# Patient Record
Sex: Male | Born: 1965 | Race: White | Hispanic: No | Marital: Married | Smoking: Never smoker
Health system: Southern US, Community
[De-identification: ages and names within clinical notes are randomized; demographics above are authoritative.]

## PROBLEM LIST (undated history)

## (undated) DIAGNOSIS — E7800 Pure hypercholesterolemia, unspecified: Secondary | ICD-10-CM

---

## 2005-11-10 ENCOUNTER — Ambulatory Visit: Payer: Self-pay | Admitting: Family Medicine

## 2007-04-16 IMAGING — CT CT ABD-PELV W/ CM
1 of 2 series · 15 of 32 positions shown, 19 images · non-contrast
Comparison: none

REASON FOR EXAM: abdominal pain and low grade fever
COMMENTS:

[Series 2: soft tissue · axial · 0.77mm/px · z∈[-379,+93]mm · 15 of 65 slices shown, 19 images]
[im 3/65  soft-tissue]
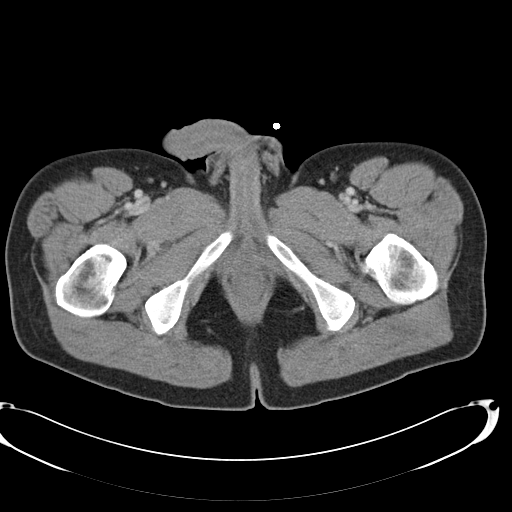
[im 3/65  bone]
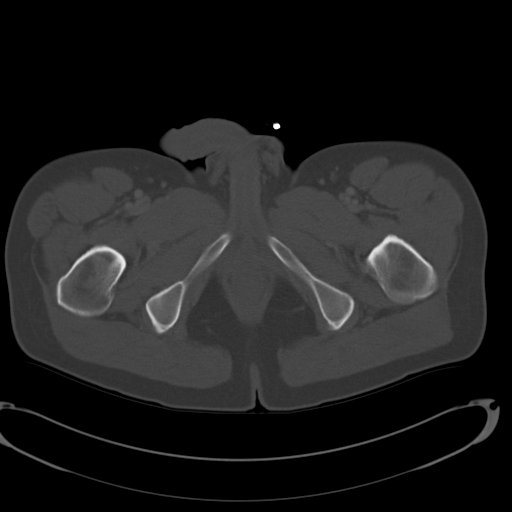
[im 9/65  soft-tissue]
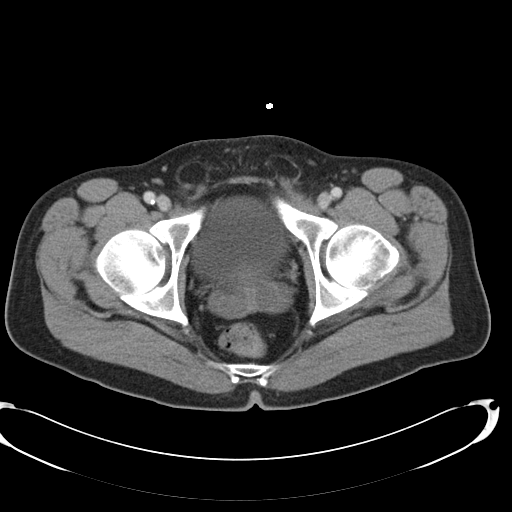
[im 15/65  soft-tissue]
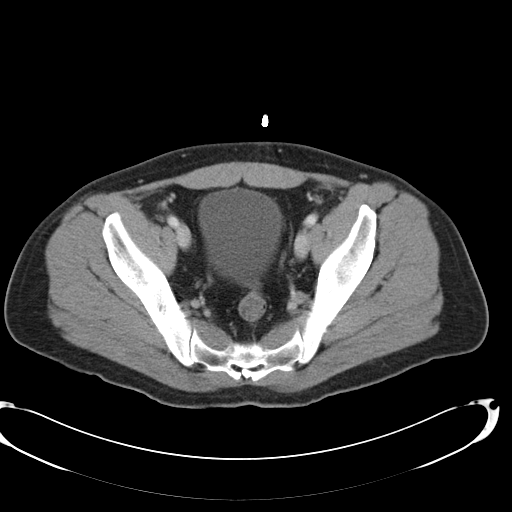
[im 18/65  soft-tissue]
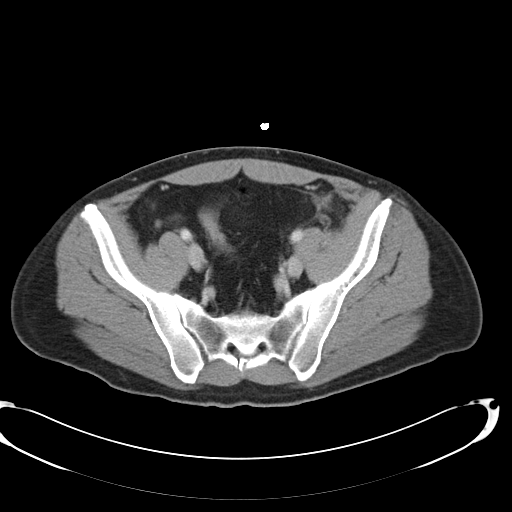
[im 24/65  soft-tissue]
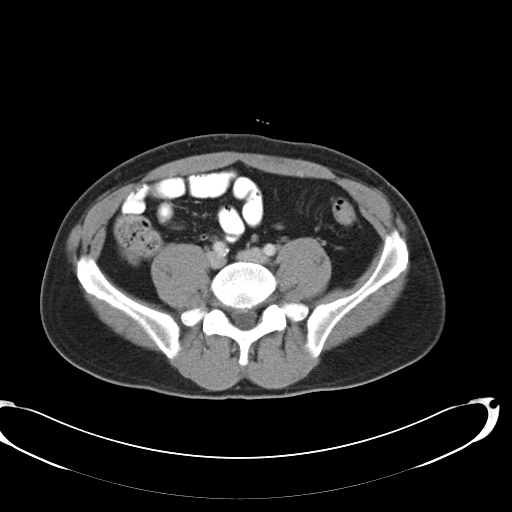
[im 27/65  soft-tissue]
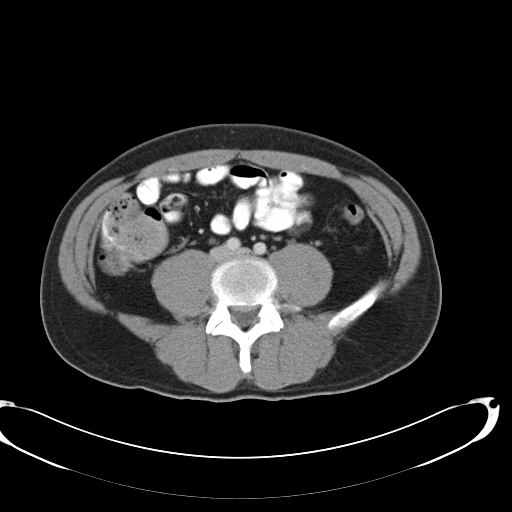
[im 33/65  soft-tissue]
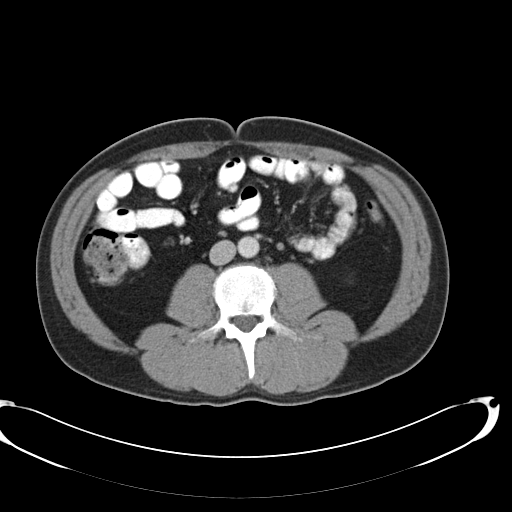
[im 38/65  soft-tissue]
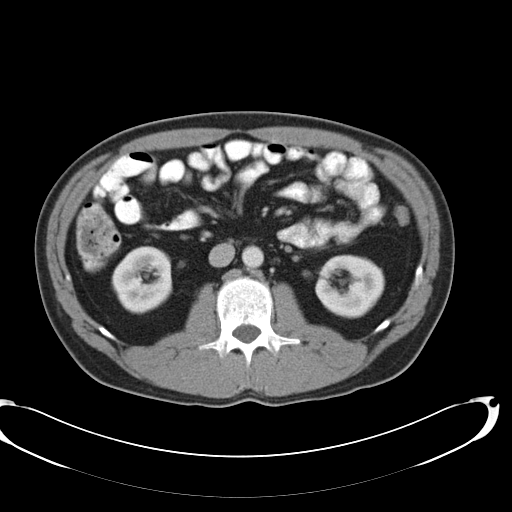
[im 41/65  soft-tissue]
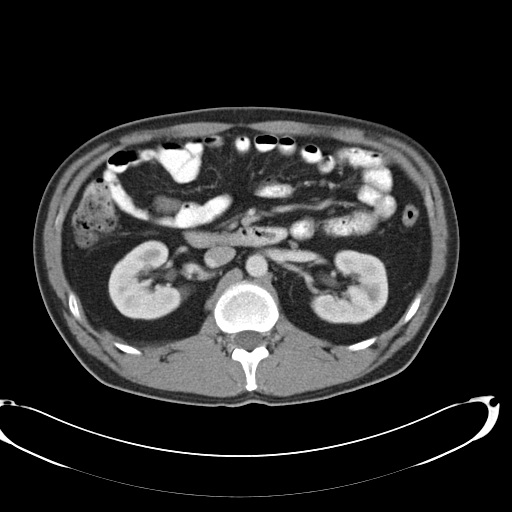
[im 41/65  bone]
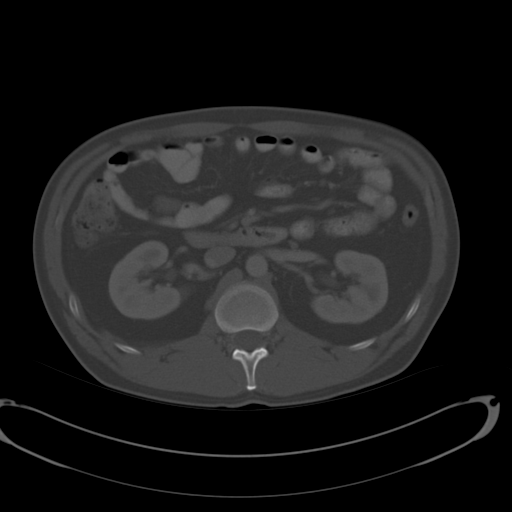
[im 47/65  soft-tissue]
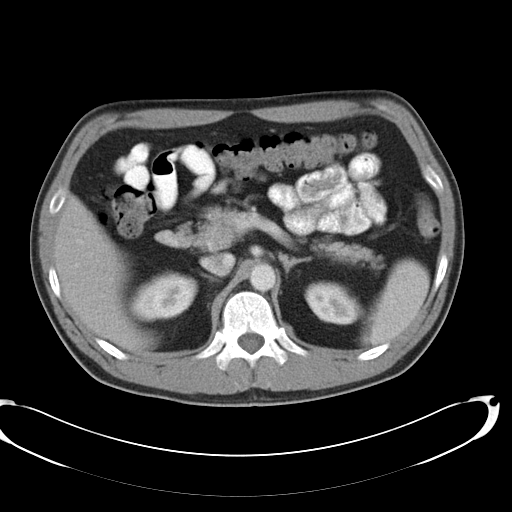
[im 50/65  soft-tissue]
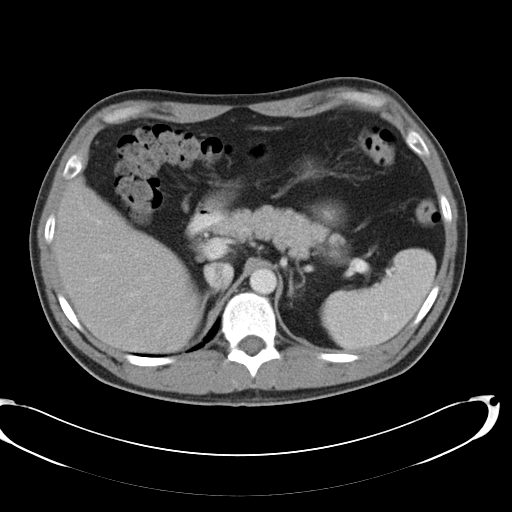
[im 53/65  lung]
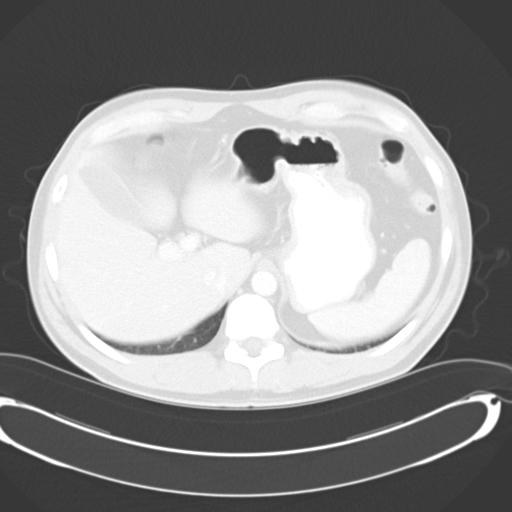
[im 56/65  soft-tissue]
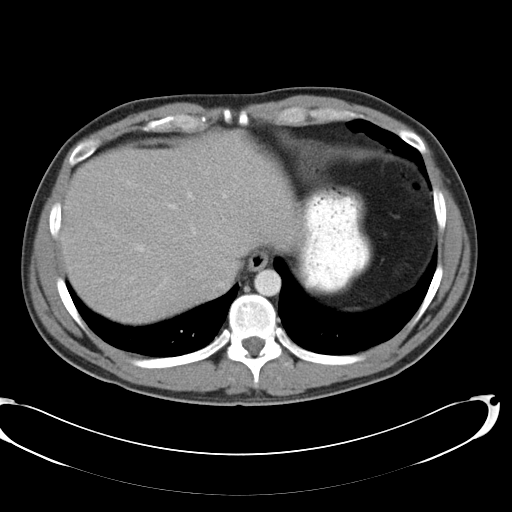
[im 56/65  lung]
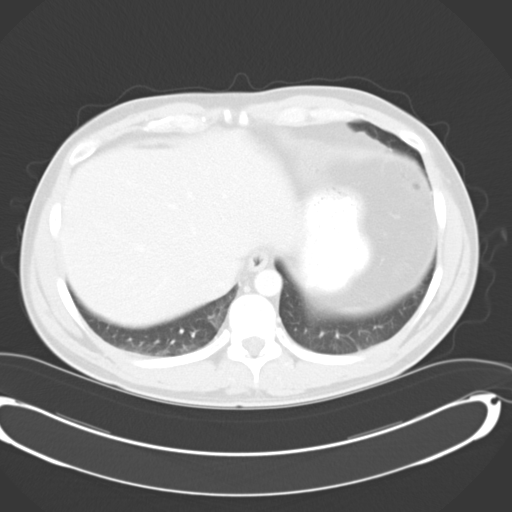
[im 59/65  lung]
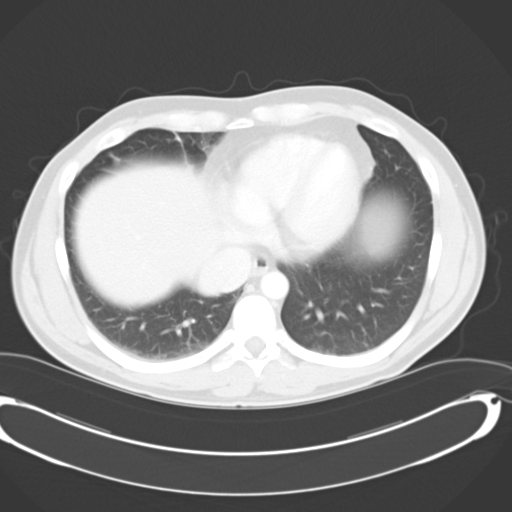
[im 62/65  soft-tissue]
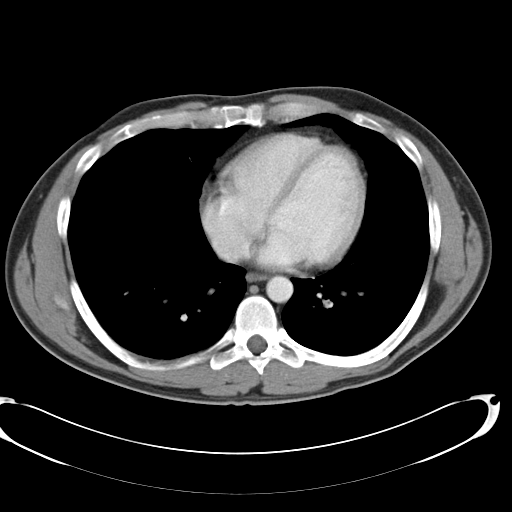
[im 62/65  lung]
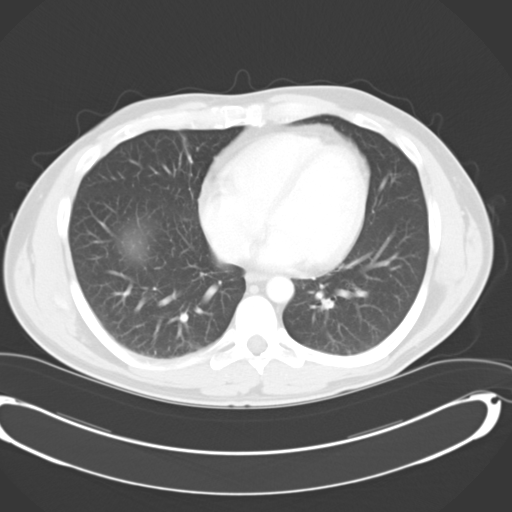

[15 of 32 positions shown; findings below may reference images not displayed]

PROCEDURE:     CT  - CT ABDOMEN / PELVIS  W  - November 10, 2005  [DATE]

RESULT:     IV and oral contrast enhanced CT scan of the abdomen and pelvis
is performed in the standard fashion. The lung bases show some minimal
subsegmental atelectasis versus fibrosis, but no infiltrate or focal mass is
demonstrated.

Images through the abdomen show a normal appearance of the liver, spleen,
gallbladder, pancreas, aorta, adrenal glands and kidneys.  There is no
evidence of abnormal bowel distention. There is no retroperitoneal
adenopathy.  The appendix appears to be normal. There is no inflammatory
reaction demonstrated. There is no free air.  No inguinal mass or adenopathy
is seen.
IMPRESSION: 1)No CT findings to suggest a diagnosis of an acute inflammatory process.
Specifically, there is no definite CT evidence of diverticulitis.  On image
#[id number], there is some minimal linear density adjacent to the colon and the
LEFT groin region.  While not pathognomonic for diverticulitis the diagnosis
cannot be completely excluded and close correlation would be recommended.
It may be beneficial to consider pharmacological therapy for an acute
infection given these very subtle changes.  There are some minimal
diverticular changes present, but no pathognomonic changes of diverticulitis
are seen.

## 2017-01-07 ENCOUNTER — Encounter: Payer: Self-pay | Admitting: Gynecology

## 2017-01-07 ENCOUNTER — Ambulatory Visit
Admission: EM | Admit: 2017-01-07 | Discharge: 2017-01-07 | Disposition: A | Discharge status: Other Acute Inpatient Hospital | Payer: BLUE CROSS/BLUE SHIELD | Attending: Family Medicine | Admitting: Family Medicine

## 2017-01-07 DIAGNOSIS — E7800 Pure hypercholesterolemia, unspecified: Secondary | ICD-10-CM | POA: Insufficient documentation

## 2017-01-07 DIAGNOSIS — R238 Other skin changes: Secondary | ICD-10-CM | POA: Diagnosis not present

## 2017-01-07 DIAGNOSIS — Z888 Allergy status to other drugs, medicaments and biological substances status: Secondary | ICD-10-CM | POA: Insufficient documentation

## 2017-01-07 DIAGNOSIS — Z79899 Other long term (current) drug therapy: Secondary | ICD-10-CM | POA: Insufficient documentation

## 2017-01-07 DIAGNOSIS — M7989 Other specified soft tissue disorders: Secondary | ICD-10-CM | POA: Insufficient documentation

## 2017-01-07 DIAGNOSIS — R600 Localized edema: Secondary | ICD-10-CM | POA: Diagnosis not present

## 2017-01-07 HISTORY — DX: Pure hypercholesterolemia, unspecified: E78.00

## 2017-01-07 NOTE — ED Provider Notes (Signed)
[facility]    CSN: [id number] Arrival date & time: 01/07/17  1156     History   Chief Complaint Chief Complaint  Patient presents with  . Arm Swelling    right lower arm    HPI Dyllin Gulley is a 51 y.o. male.   51 yo male with a c/o right arm swelling and redness that started about 30 minutes ago with swelling and prominence of his forearm veins. States now skin discoloration is different and has turned more purplish color. Patient states he was finishing lunch when symptoms began. Denies any pain, however states "forearm feels tight". Denies any injuries/trauma, physical exertion, chest pains, shortness of breath, wheezing, fevers, chills, insect bites, facial or extremity numbness, vision changes.    The history is provided by the patient.    Past Medical History:  Diagnosis Date  . Hypercholesteremia     There are no active problems to display for this patient.   History reviewed. No pertinent surgical history.     Home Medications    Prior to Admission medications   Medication Sig Start Date End Date Taking? Authorizing Provider  fluticasone (FLONASE) 50 MCG/ACT nasal spray Place 1 spray into both nostrils daily.   Yes [provider]  L-Arginine 500 MG TABS Take by mouth.   Yes [provider]    Family History History reviewed. No pertinent family history.  Social History Social History  Substance Use Topics  . Smoking status: Never Smoker  . Smokeless tobacco: Never Used  . Alcohol use No     Allergies   Ciprofloxacin   Review of Systems Review of Systems   Physical Exam Triage Vital Signs ED Triage Vitals  Enc Vitals Group     BP 01/07/17 1252 139/76     Pulse Rate 01/07/17 1211 69     Resp 01/07/17 1211 16     Temp 01/07/17 1211 97.8 F (36.6 C)     Temp src --      SpO2 01/07/17 1211 100 %     Weight 01/07/17 1201 175 lb (79.4 kg)     Height 01/07/17 1201  (1.778 m)     Head Circumference  --      Peak Flow --      Pain Score 01/07/17 1201 2     Pain Loc --      Pain Edu? --      Excl. in [place]? --    No data found.   Updated Vital Signs BP 139/76 (BP Location: Left Arm)   Pulse 69   Temp 97.8 F (36.6 C)   Resp 16   Ht  (1.778 m)   Wt 175 lb (79.4 kg)   SpO2 100%   BMI 25.11 kg/m   Visual Acuity Right Eye Distance:   Left Eye Distance:   Bilateral Distance:    Right Eye Near:   Left Eye Near:    Bilateral Near:     Physical Exam  Constitutional: He appears well-developed and well-nourished. No distress.  Cardiovascular: Normal rate, regular rhythm, normal heart sounds and intact distal pulses.   Pulses:      Radial pulses are 3+ on the right side.  Brachial and radial pulses 2-3 +  Pulmonary/Chest: Effort normal and breath sounds normal. No respiratory distress. He has no wheezes. He has no rales.  Skin: Capillary refill takes 2 to 3 seconds. He is not diaphoretic.     Right forearm edema noted;  right arm skin bluish-purple hue, duskiness noted; right arm/wrist arterial pulses normal  Nursing note and vitals reviewed.    UC Treatments / Results  Labs (all labs ordered are listed, but only abnormal results are displayed) Labs Reviewed - No data to display  EKG  EKG Interpretation None       Radiology No results found.  Procedures ED EKG Date/Time: 01/07/2017 1:01 PM Performed by: Payton Mccallum Authorized by: Payton Mccallum   ECG reviewed by ED Physician in the absence of a cardiologist: yes   Previous ECG:    Previous ECG:  Unavailable Interpretation:    Interpretation: normal   Rate:    ECG rate assessment: normal   Rhythm:    Rhythm: sinus rhythm   Ectopy:    Ectopy: none   QRS:    QRS axis:  Normal Conduction:    Conduction: normal   ST segments:    ST segments:  Normal T waves:    T waves: normal     (including critical care time)  Medications Ordered in UC Medications - No data to display   Initial  Impression / Assessment and Plan / UC Course  I have reviewed the triage vital signs and the nursing notes.  Pertinent labs & imaging results that were available during my care of the patient were reviewed by me and considered in my medical decision making (see chart for details).       Final Clinical Impressions(s) / UC Diagnoses   Final diagnoses:  Arm edema  Dusky discoloration of skin    New Prescriptions Discharge Medication List as of 01/07/2017 12:47 PM     1. Possible etiologies and possible diagnosis reviewed with patient and wife; due to acute onset of symptoms and possible etiologies, recommend patient go to the Emergency Department for further evaluation  and management; patient verbalizes understanding and will proceed to ED by private vehicle  Controlled Substance Prescriptions [place] Controlled Substance Registry consulted? Not Applicable   Payton Mccallum, MD 01/07/17 1309

## 2017-01-07 NOTE — ED Triage Notes (Signed)
Patient c/o right arm redness and swelling that started 20 min. ago.  Patient states that he was eating barbacue at lunch today prior to this happening.  Patient denies tongue swelling or difficulty breathing.

## 2017-01-07 NOTE — ED Notes (Signed)
Measurements:  Right: Upper Arm- 13inch, AC-12.25in, Wrist: 7in  Left: Upper Arm- 13in, AC-11.5in, Wrist: 7in

## 2017-01-07 NOTE — Discharge Instructions (Signed)
Recommend patient go to Emergency Department for further evaluation of acute right arm swelling and skin duskiness (possible venous ultrasound)

## 2019-04-14 ENCOUNTER — Other Ambulatory Visit: Payer: Self-pay

## 2019-04-14 ENCOUNTER — Ambulatory Visit
Admission: EM | Admit: 2019-04-14 | Discharge: 2019-04-14 | Disposition: A | Discharge status: Home / Self Care | Payer: BC Managed Care – PPO | Attending: Family Medicine | Admitting: Family Medicine

## 2019-04-14 DIAGNOSIS — Z20828 Contact with and (suspected) exposure to other viral communicable diseases: Secondary | ICD-10-CM

## 2019-04-14 DIAGNOSIS — Z20822 Contact with and (suspected) exposure to covid-19: Secondary | ICD-10-CM

## 2019-04-14 NOTE — ED Provider Notes (Signed)
[facility]    CSN: [id number] Arrival date & time: 04/14/19  0930  History   Chief Complaint Chief Complaint  Patient presents with  . covid exposure   HPI  53 year old male presents with Covid exposure.  Patient reports that he was recently had a meeting with a colleague/coworker.  Meeting was on Thursday.  He reports that this individual tested positive for Covid.  They were notified today.  He presents today for Covid testing.  He is asymptomatic and feeling well.  Desires testing.  No other complaints or concerns at this time.  PMH, Surgical Hx, Family Hx, Social History reviewed and updated as below.  PMH: Hypercholesterolemia    Chronic prostatitis    Hx of colonic polyps 2007   Insomnia    Erectile dysfunction    Deviated septum    History of gynecomastia    Lymphocele 03/12/2017   Moderate OSA: Overall AHI 25/hr       Surgical Hx: UNLISTED PROCEDURE BREAST ca 1983 Right removal of breast tissue for gynecomastia   EXCISION 1ST &/OR CERVICLE RIB 01/11/2017 Right Procedure: EXCISION FIRST AND/OR CERVICAL RIB, right neck; Surgeon: Vena Austria, MD; Location: [facility]; Service: General Surgery; Laterality: Right;  Medical devices from this surgery are in the Implants section.      Home Medications    Prior to Admission medications   Medication Sig Start Date End Date Taking? Authorizing Provider  rosuvastatin (CRESTOR) 10 MG tablet TAKE 1 TABLET BY MOUTH ONCE DAILY 12/04/18  Yes [provider]  tadalafil (CIALIS) 5 MG tablet Take by mouth. 08/20/18  Yes [provider]  aspirin 81 MG EC tablet Take by mouth.    [provider]  L-Arginine 500 MG TABS Take by mouth.    [provider]  fluticasone (FLONASE) 50 MCG/ACT nasal spray Place 1 spray into both nostrils daily.  04/14/19  [provider]    Family History Cancer Father Arty Baumgartner Prostate Cancer  Lymphoma Father  Kippy Gohman   Prostate cancer Father Tramain Gershman   Cancer Maternal Grandfather    Cancer Maternal Grandmother    Colon cancer Maternal Uncle    Alzheimer's disease Maternal Uncle Leon Rimmer   High blood pressure (Hypertension) Mother Kamon Fahr   Leukemia Paternal Grandmother    No Known Problems Sister    No Known Problems Sister    Breast cancer Neg Hx    Coronary Artery Disease (Blocked arteries around heart) Neg Hx     Social History Social History   Tobacco Use  . Smoking status: Never Smoker  . Smokeless tobacco: Never Used  Substance Use Topics  . Alcohol use: Yes    Comment: social  . Drug use: Not Currently     Allergies   Ciprofloxacin   Review of Systems Review of Systems  Constitutional: Negative.   HENT: Negative.   Respiratory: Negative.    Physical Exam Triage Vital Signs ED Triage Vitals  Enc Vitals Group     BP 04/14/19 1002 134/78     Pulse Rate 04/14/19 1002 (!) 55     Resp 04/14/19 1002 16     Temp 04/14/19 1002 98.6 F (37 C)     Temp Source 04/14/19 1002 Oral     SpO2 04/14/19 1002 100 %     Weight 04/14/19 1004 170 lb (77.1 kg)     Height 04/14/19 1004 5\' 10"  (1.778 m)     Head Circumference --  Peak Flow --      Pain Score 04/14/19 1004 0     Pain Loc --      Pain Edu? --      Excl. in [place]? --    Updated Vital Signs BP 134/78 (BP Location: Left Arm)   Pulse (!) 55   Temp 98.6 F (37 C) (Oral)   Resp 16   Ht 5\' 10"  (1.778 m)   Wt 77.1 kg   SpO2 100%   BMI 24.39 kg/m   Visual Acuity Right Eye Distance:   Left Eye Distance:   Bilateral Distance:    Right Eye Near:   Left Eye Near:    Bilateral Near:     Physical Exam Vitals and nursing note reviewed.  Constitutional:      General: He is not in acute distress.    Appearance: Normal appearance. He is not ill-appearing.  HENT:     Head: Normocephalic and atraumatic.  Eyes:     General:        Right eye: No discharge.        Left eye:  No discharge.     Conjunctiva/sclera: Conjunctivae normal.  Cardiovascular:     Rate and Rhythm: Normal rate and regular rhythm.     Heart sounds: No murmur.  Pulmonary:     Effort: Pulmonary effort is normal.     Breath sounds: Normal breath sounds. No wheezing, rhonchi or rales.  Neurological:     Mental Status: He is alert.  Psychiatric:        Mood and Affect: Mood normal.        Behavior: Behavior normal.    UC Treatments / Results  Labs (all labs ordered are listed, but only abnormal results are displayed) Labs Reviewed  NOVEL CORONAVIRUS, NAA (HOSP ORDER, SEND-OUT TO REF LAB; TAT 18-24 HRS)    EKG   Radiology No results found.  Procedures Procedures (including critical care time)  Medications Ordered in UC Medications - No data to display  Initial Impression / Assessment and Plan / UC Course  I have reviewed the triage vital signs and the nursing notes.  Pertinent labs & imaging results that were available during my care of the patient were reviewed by me and considered in my medical decision making (see chart for details).    53 year old male presents with Covid exposure.  Awaiting test results.  Supportive care.  Work note given.  Final Clinical Impressions(s) / UC Diagnoses   Final diagnoses:  Exposure to COVID-19 virus     Discharge Instructions     Test results available (typically) in 24-48 hours.  Take care  Dr. Lacinda Axon   ED Prescriptions    None     PDMP not reviewed this encounter.   Coral Spikes, DO 04/14/19 1016

## 2019-04-14 NOTE — Discharge Instructions (Signed)
Test results available (typically) in 24-48 hours.  Take care  Dr. Lacinda Axon

## 2019-04-14 NOTE — ED Triage Notes (Signed)
Pt was exposed to a co-worker who found out today he is COVID positive. Pt has no symptoms

## 2019-04-15 LAB — NOVEL CORONAVIRUS, NAA (HOSP ORDER, SEND-OUT TO REF LAB; TAT 18-24 HRS): SARS-CoV-2, NAA: NOT DETECTED
